# Patient Record
Sex: Female | Born: 1980 | Race: Black or African American | Hispanic: No | Marital: Single | Smoking: Never smoker
Health system: Southern US, Community
[De-identification: ages and names within clinical notes are randomized; demographics above are authoritative.]

## PROBLEM LIST (undated history)

## (undated) DIAGNOSIS — Z22322 Carrier or suspected carrier of Methicillin resistant Staphylococcus aureus: Secondary | ICD-10-CM

## (undated) HISTORY — PX: OTHER SURGICAL HISTORY: SHX169

---

## 2003-03-16 ENCOUNTER — Ambulatory Visit (HOSPITAL_COMMUNITY): Admission: RE | Admit: 2003-03-16 | Discharge: 2003-03-16 | Payer: Self-pay | Admitting: Obstetrics & Gynecology

## 2003-03-16 ENCOUNTER — Encounter: Payer: Self-pay | Admitting: Obstetrics & Gynecology

## 2003-03-23 ENCOUNTER — Ambulatory Visit (HOSPITAL_COMMUNITY): Admission: RE | Admit: 2003-03-23 | Discharge: 2003-03-23 | Payer: Self-pay | Admitting: Obstetrics & Gynecology

## 2003-03-23 ENCOUNTER — Encounter: Payer: Self-pay | Admitting: Obstetrics & Gynecology

## 2003-03-30 ENCOUNTER — Encounter: Payer: Self-pay | Admitting: Obstetrics & Gynecology

## 2003-03-30 ENCOUNTER — Ambulatory Visit (HOSPITAL_COMMUNITY): Admission: RE | Admit: 2003-03-30 | Discharge: 2003-03-30 | Payer: Self-pay | Admitting: Obstetrics & Gynecology

## 2003-06-20 ENCOUNTER — Encounter: Payer: Self-pay | Admitting: Obstetrics & Gynecology

## 2003-06-20 ENCOUNTER — Ambulatory Visit (HOSPITAL_COMMUNITY): Admission: RE | Admit: 2003-06-20 | Discharge: 2003-06-20 | Payer: Self-pay | Admitting: Obstetrics & Gynecology

## 2003-07-21 ENCOUNTER — Inpatient Hospital Stay (HOSPITAL_COMMUNITY): Admission: AD | Admit: 2003-07-21 | Discharge: 2003-07-23 | Payer: Self-pay | Admitting: Obstetrics

## 2004-07-14 ENCOUNTER — Encounter: Admission: RE | Admit: 2004-07-14 | Discharge: 2004-07-14 | Payer: Self-pay | Admitting: Internal Medicine

## 2005-09-28 ENCOUNTER — Emergency Department (HOSPITAL_COMMUNITY): Admission: EM | Admit: 2005-09-28 | Discharge: 2005-09-28 | Payer: Self-pay | Admitting: Emergency Medicine

## 2006-01-25 ENCOUNTER — Emergency Department (HOSPITAL_COMMUNITY): Admission: EM | Admit: 2006-01-25 | Discharge: 2006-01-25 | Payer: Self-pay | Admitting: Emergency Medicine

## 2006-01-28 ENCOUNTER — Emergency Department: Payer: Self-pay | Admitting: Internal Medicine

## 2006-04-28 IMAGING — CT CT ABDOMEN W/ CM
1 of 2 series · 15 of 32 positions shown, 19 images · IV contrast (GASTROGRAFIN & [ID] OMNI 300)
Comparison: none

CLINICAL DATA: Abdominal pain, cramping.
 CT ABDOMEN WITH CONTRAST:
 Multidetector helical scans through the abdomen were performed after oral and IV contrast media were given.  100 cc of Omnipaque 300 were given as the contrast media.
 The lung bases are clear.  The liver enhances normally with no focal abnormality.  No ductal dilatation is seen.  The pancreas is normal in size with normal peripancreatic fat planes.  The adrenal glands are normal, as is the spleen.  The kidneys enhance normally and, on delayed images, the pelvocaliceal systems appear normal.  No adenopathy is seen.  The abdominal aorta is normal in caliber.

[Series 2: a&p w/ · axial · 0.59mm/px · z∈[-304,+26]mm · 15 of 73 slices shown, 19 images]
[im 4/73  soft-tissue]
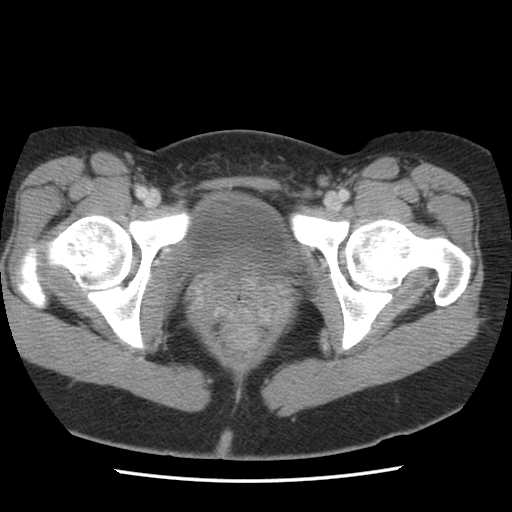
[im 4/73  bone]
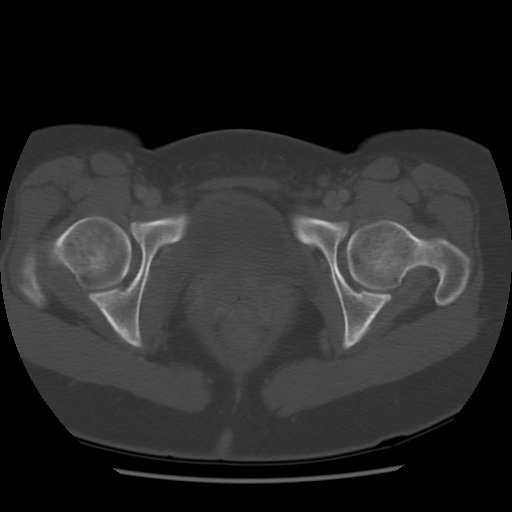
[im 10/73  soft-tissue]
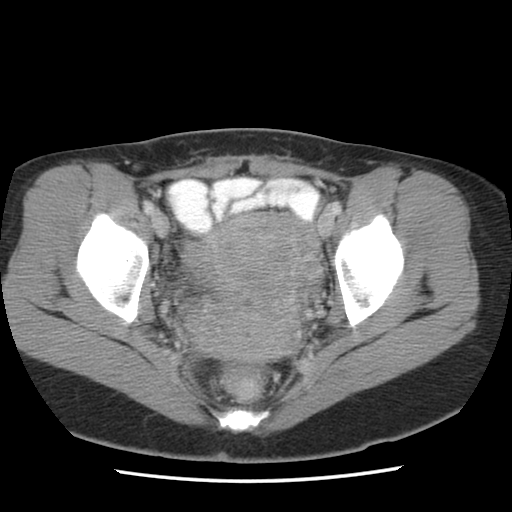
[im 16/73  soft-tissue]
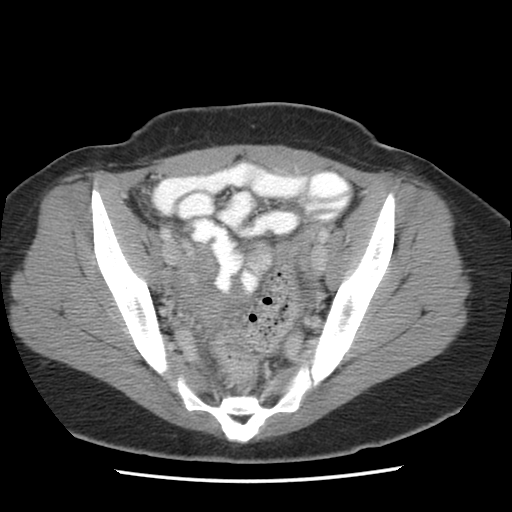
[im 19/73  soft-tissue]
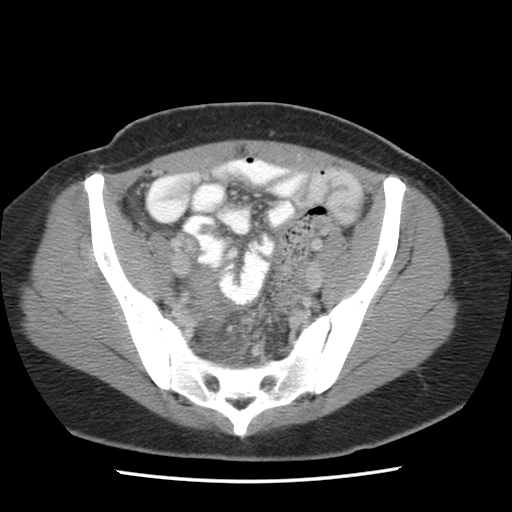
[im 26/73  soft-tissue]
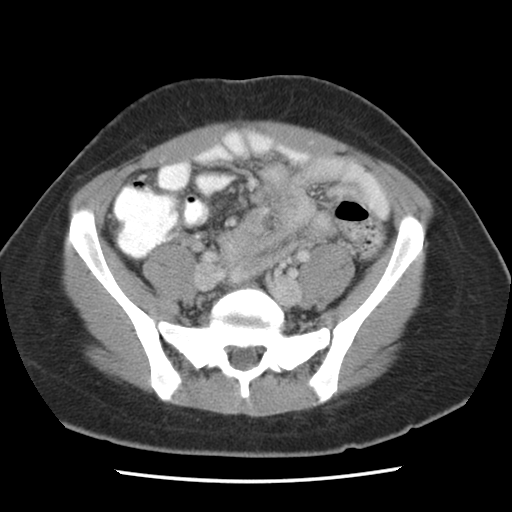
[im 32/73  soft-tissue]
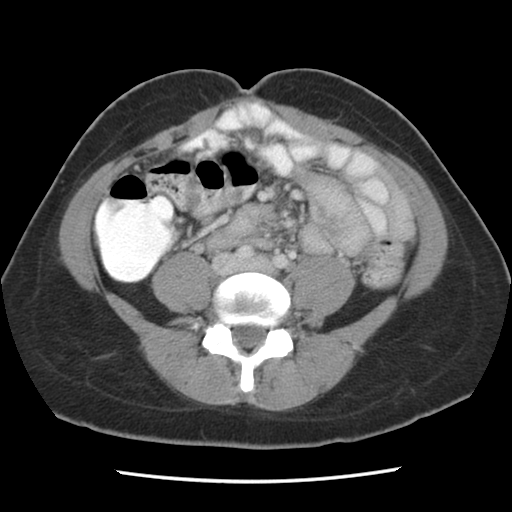
[im 38/73  soft-tissue]
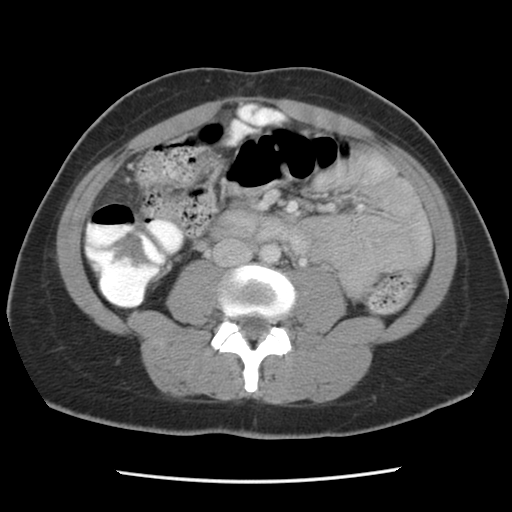
[im 41/73  soft-tissue]
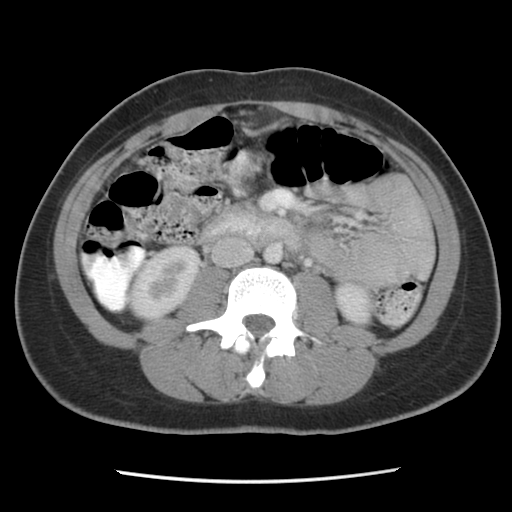
[im 47/73  soft-tissue]
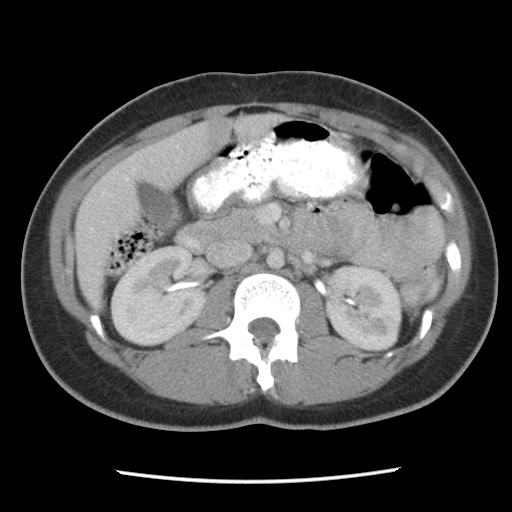
[im 47/73  bone]
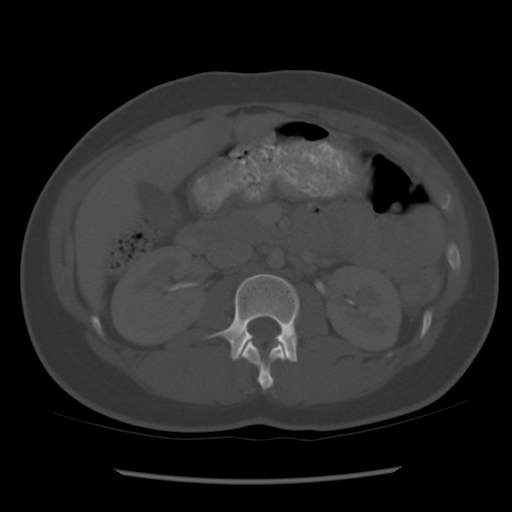
[im 54/73  soft-tissue]
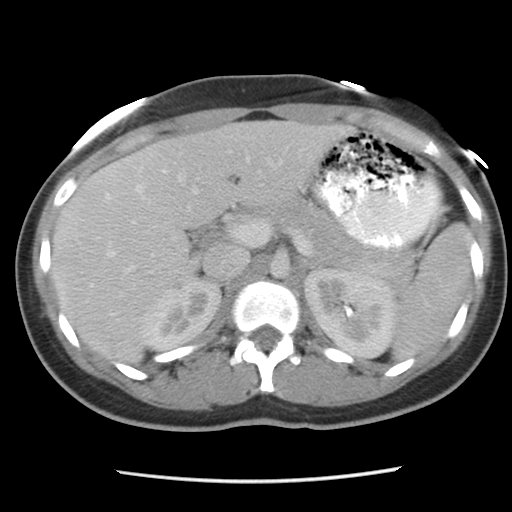
[im 57/73  soft-tissue]
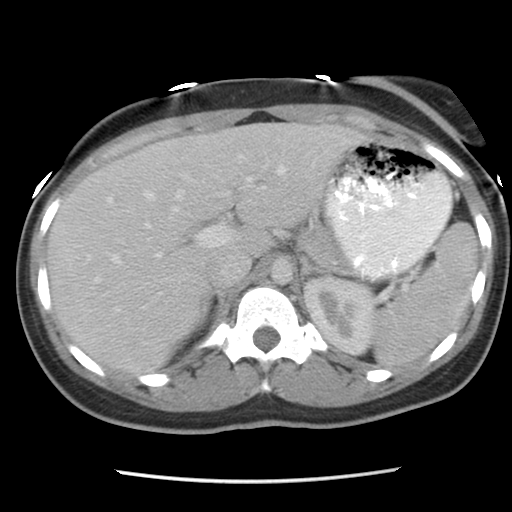
[im 60/73  lung]
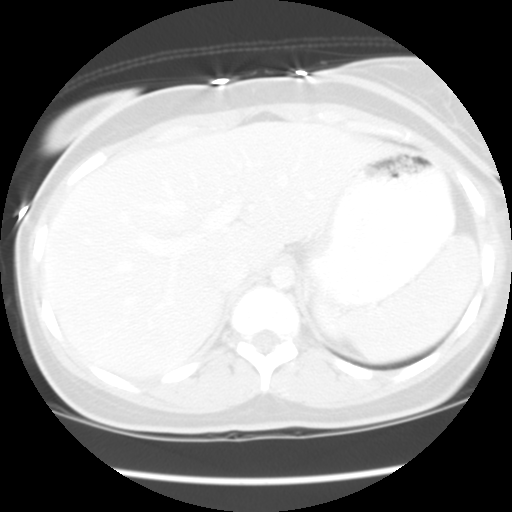
[im 63/73  soft-tissue]
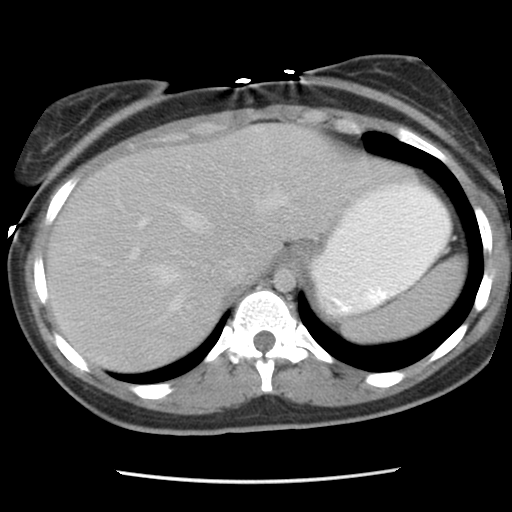
[im 63/73  lung]
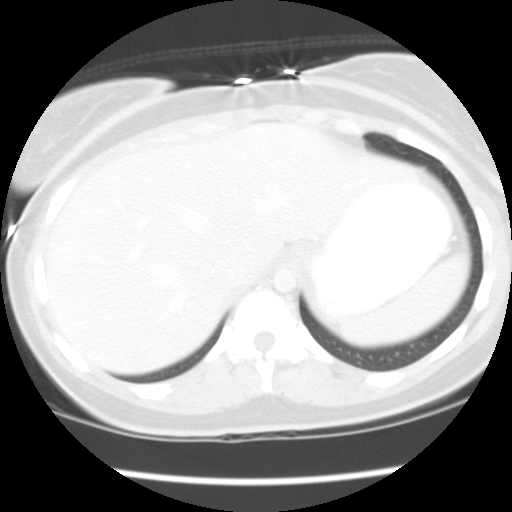
[im 66/73  lung]
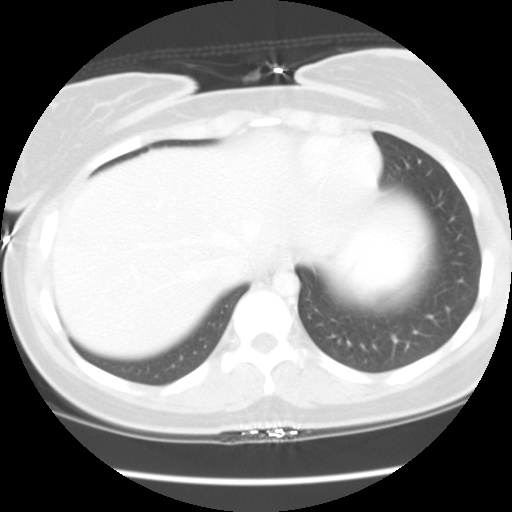
[im 69/73  soft-tissue]
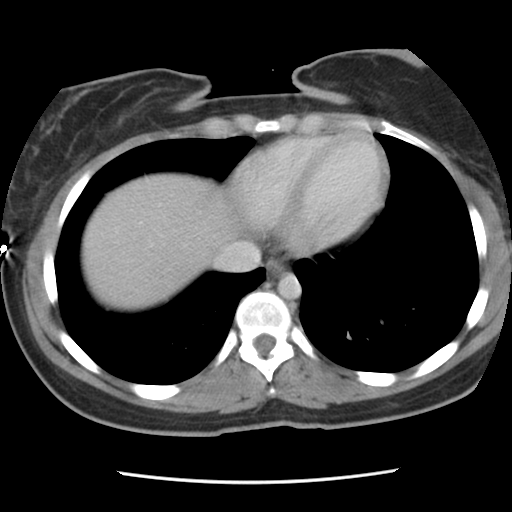
[im 69/73  lung]
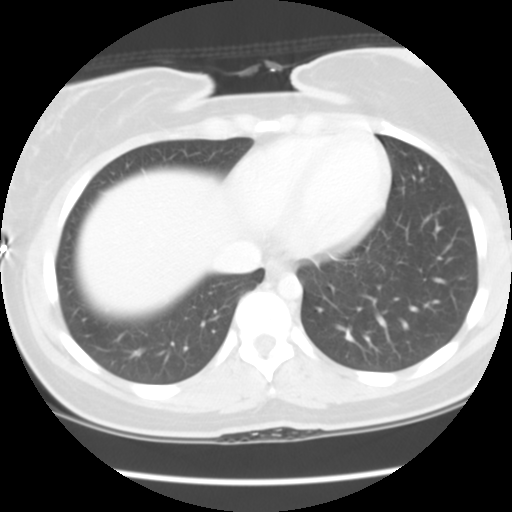

[15 of 32 positions shown; findings below may reference images not displayed]

IMPRESSION: Negative CT of the abdomen.
 CT PELVIS WITH CONTRAST 
 Scans were continued through the pelvis after oral and IV contrast media were given.  The uterus is very inhomogeneous in enhancement pattern and small fibroids cannot be excluded.  There are ovarian follicles present bilaterally.   The urinary bladder is decompressed and unremarkable.  The appendix is normal in size.
IMPRESSION: 1.  Uterus demonstrates somewhat inhomogeneous enhancement pattern of uncertain significance.  Small fibroids cannot be excluded.
 2.  Small ovarian follicles are noted bilaterally. 
 3.  Appendix appears normal.

## 2006-12-20 ENCOUNTER — Emergency Department (HOSPITAL_COMMUNITY): Admission: EM | Admit: 2006-12-20 | Discharge: 2006-12-20 | Payer: Self-pay | Admitting: Family Medicine

## 2007-04-20 ENCOUNTER — Encounter: Admission: RE | Admit: 2007-04-20 | Discharge: 2007-04-20 | Payer: Self-pay | Admitting: Obstetrics

## 2009-06-07 ENCOUNTER — Emergency Department (HOSPITAL_COMMUNITY): Admission: EM | Admit: 2009-06-07 | Discharge: 2009-06-07 | Payer: Self-pay | Admitting: Emergency Medicine

## 2009-07-29 ENCOUNTER — Emergency Department (HOSPITAL_COMMUNITY): Admission: EM | Admit: 2009-07-29 | Discharge: 2009-07-29 | Payer: Self-pay | Admitting: Emergency Medicine

## 2009-12-18 ENCOUNTER — Emergency Department (HOSPITAL_COMMUNITY): Admission: EM | Admit: 2009-12-18 | Discharge: 2009-12-18 | Payer: Self-pay | Admitting: Emergency Medicine

## 2010-05-23 ENCOUNTER — Emergency Department (HOSPITAL_COMMUNITY): Admission: EM | Admit: 2010-05-23 | Discharge: 2010-05-23 | Payer: Self-pay | Admitting: Emergency Medicine

## 2011-01-03 ENCOUNTER — Emergency Department (HOSPITAL_COMMUNITY)
Admission: EM | Admit: 2011-01-03 | Discharge: 2011-01-03 | Disposition: A | Discharge status: Home / Self Care | Payer: Medicaid Other | Attending: Emergency Medicine | Admitting: Emergency Medicine

## 2011-01-03 DIAGNOSIS — Y929 Unspecified place or not applicable: Secondary | ICD-10-CM | POA: Insufficient documentation

## 2011-01-03 DIAGNOSIS — M25519 Pain in unspecified shoulder: Secondary | ICD-10-CM | POA: Insufficient documentation

## 2011-03-25 ENCOUNTER — Emergency Department (HOSPITAL_COMMUNITY)
Admission: EM | Admit: 2011-03-25 | Discharge: 2011-03-25 | Disposition: A | Discharge status: Home / Self Care | Payer: Medicaid Other | Attending: Emergency Medicine | Admitting: Emergency Medicine

## 2011-03-25 ENCOUNTER — Emergency Department (HOSPITAL_COMMUNITY): Payer: Medicaid Other

## 2011-03-25 DIAGNOSIS — R109 Unspecified abdominal pain: Secondary | ICD-10-CM | POA: Insufficient documentation

## 2011-03-25 LAB — WET PREP, GENITAL
Clue Cells Wet Prep HPF POC: NONE SEEN
Yeast Wet Prep HPF POC: NONE SEEN

## 2011-03-25 LAB — URINALYSIS, ROUTINE W REFLEX MICROSCOPIC
Ketones, ur: NEGATIVE mg/dL
Leukocytes, UA: NEGATIVE
Nitrite: NEGATIVE
Protein, ur: NEGATIVE mg/dL
pH: 8 (ref 5.0–8.0)

## 2011-03-25 LAB — POCT PREGNANCY, URINE: Preg Test, Ur: NEGATIVE

## 2011-03-26 LAB — GC/CHLAMYDIA PROBE AMP, GENITAL: Chlamydia, DNA Probe: POSITIVE — AB

## 2013-01-06 IMAGING — CR DG ABDOMEN 2V
2 series · 2 of 2 positions shown · non-contrast
Comparison: Scout image for abdominal CT scan dated 07/14/2004

CLINICAL DATA: Abdominal pain and nausea.

ABDOMEN - 2 VIEW

[w abdomen upright]
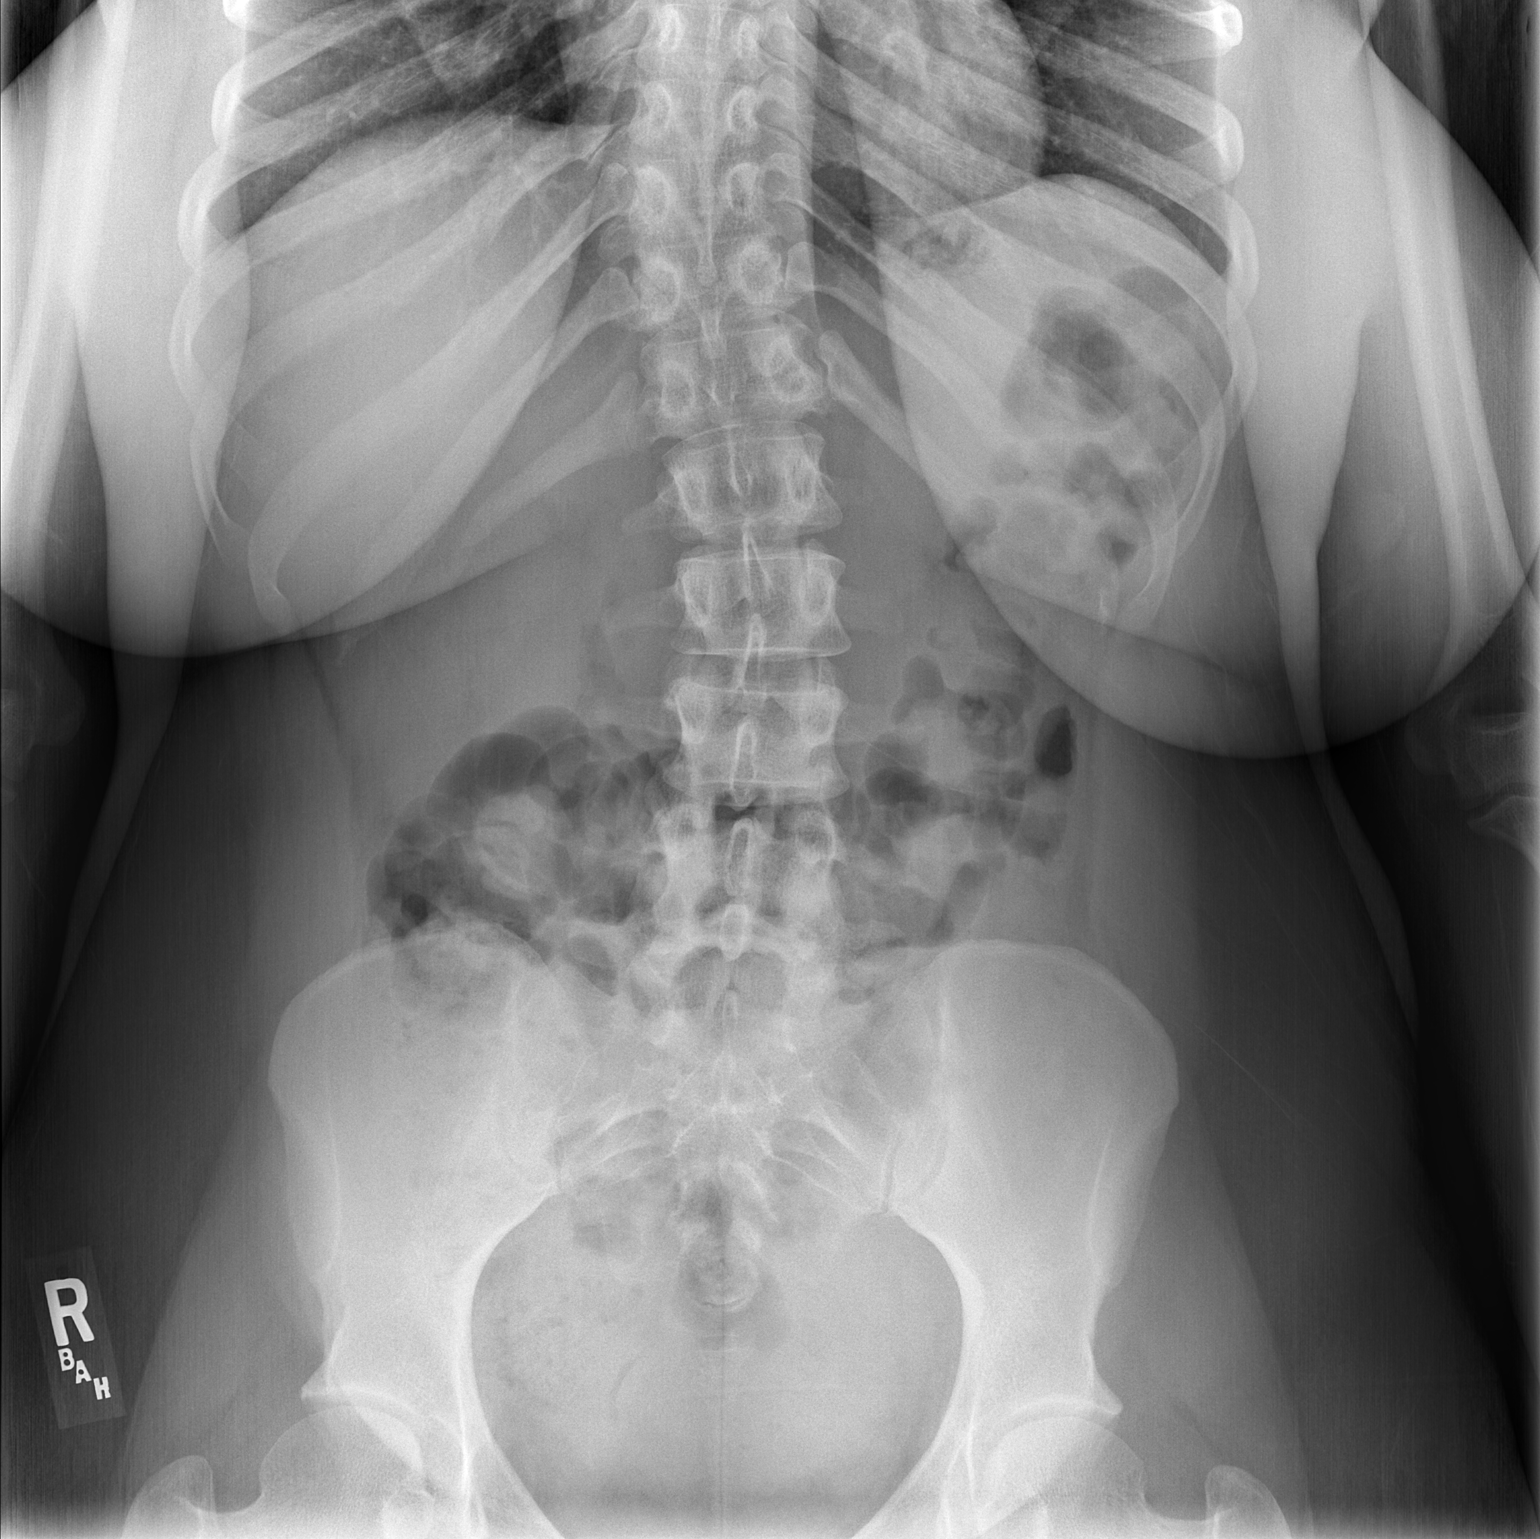

[t abdomen supine]
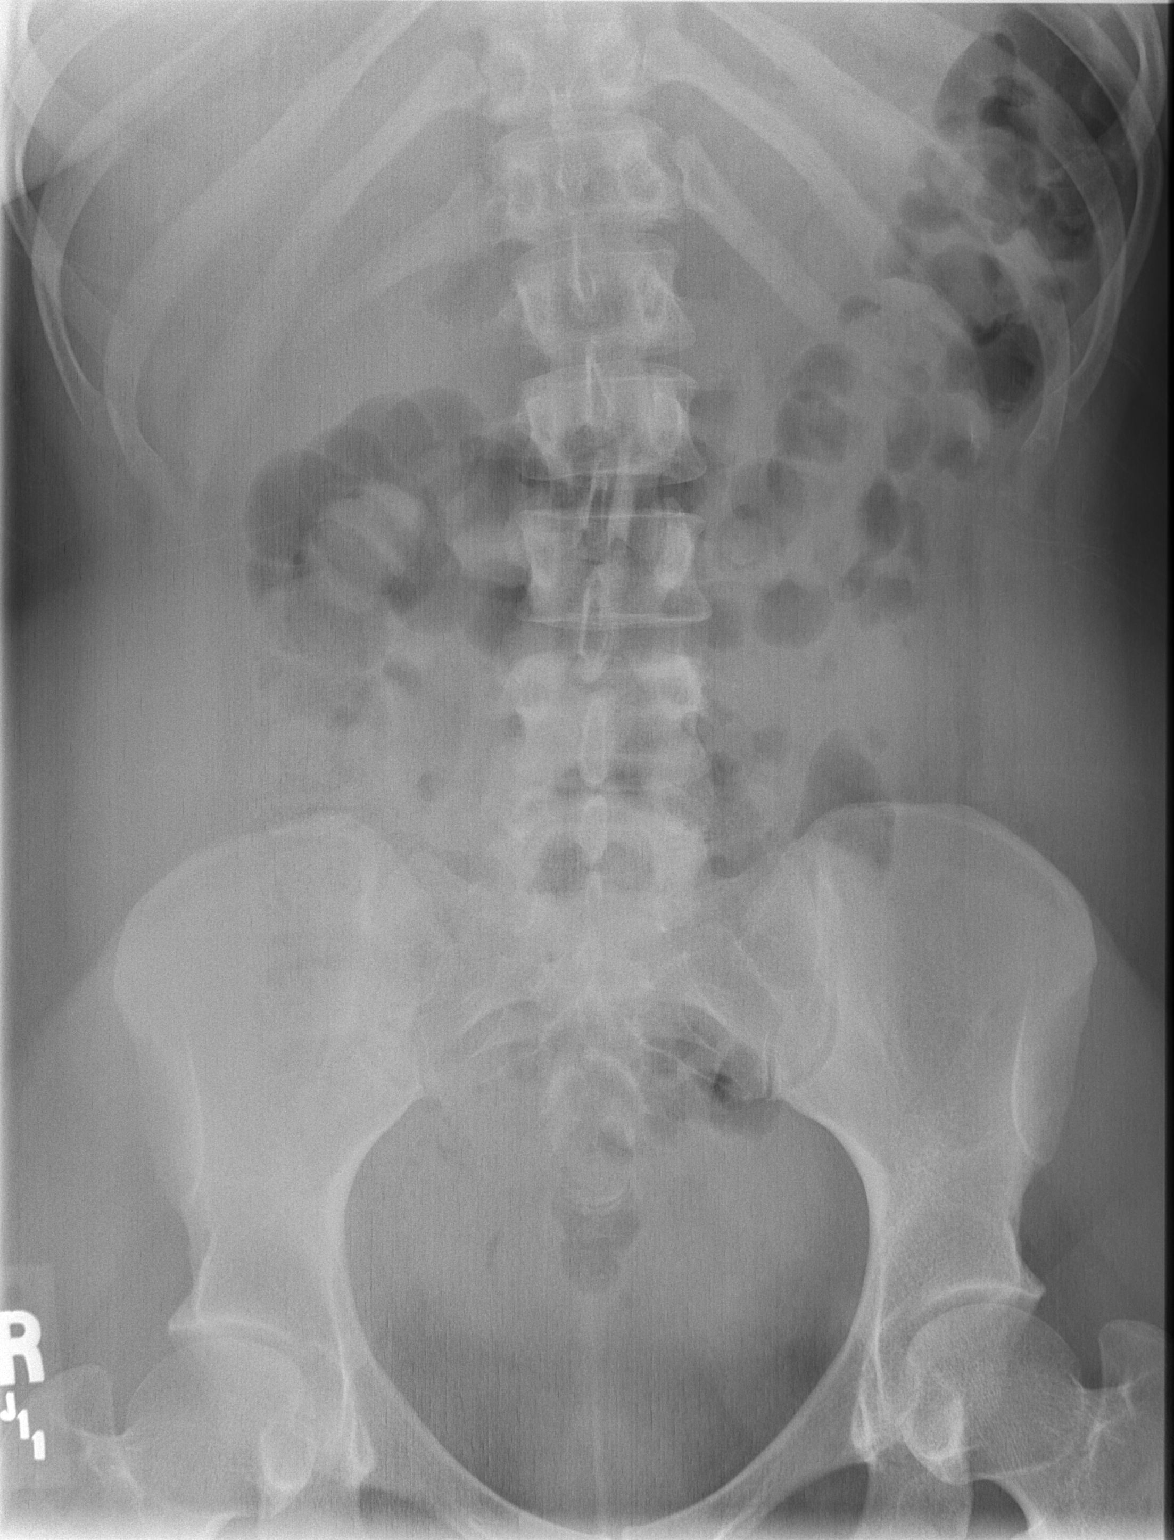

[2 of 2 positions shown; findings below may reference images not displayed]

FINDINGS: There is no free air free fluid in the abdomen.  Bowel
gas pattern is normal.  No worrisome abdominal calcifications.  No
significant osseous abnormality.
IMPRESSION: Benign-appearing abdomen.

## 2016-02-20 ENCOUNTER — Emergency Department (HOSPITAL_COMMUNITY)
Admission: EM | Admit: 2016-02-20 | Discharge: 2016-02-20 | Disposition: A | Discharge status: Home / Self Care | Payer: Medicaid Other | Attending: Emergency Medicine | Admitting: Emergency Medicine

## 2016-02-20 ENCOUNTER — Encounter (HOSPITAL_COMMUNITY): Payer: Self-pay | Admitting: Emergency Medicine

## 2016-02-20 DIAGNOSIS — Y9389 Activity, other specified: Secondary | ICD-10-CM | POA: Diagnosis not present

## 2016-02-20 DIAGNOSIS — S80862A Insect bite (nonvenomous), left lower leg, initial encounter: Secondary | ICD-10-CM | POA: Diagnosis not present

## 2016-02-20 DIAGNOSIS — S40862A Insect bite (nonvenomous) of left upper arm, initial encounter: Secondary | ICD-10-CM | POA: Insufficient documentation

## 2016-02-20 DIAGNOSIS — Y999 Unspecified external cause status: Secondary | ICD-10-CM | POA: Diagnosis not present

## 2016-02-20 DIAGNOSIS — S30861A Insect bite (nonvenomous) of abdominal wall, initial encounter: Secondary | ICD-10-CM | POA: Diagnosis not present

## 2016-02-20 DIAGNOSIS — Y92009 Unspecified place in unspecified non-institutional (private) residence as the place of occurrence of the external cause: Secondary | ICD-10-CM | POA: Insufficient documentation

## 2016-02-20 DIAGNOSIS — S80861A Insect bite (nonvenomous), right lower leg, initial encounter: Secondary | ICD-10-CM | POA: Insufficient documentation

## 2016-02-20 DIAGNOSIS — W57XXXA Bitten or stung by nonvenomous insect and other nonvenomous arthropods, initial encounter: Secondary | ICD-10-CM | POA: Insufficient documentation

## 2016-02-20 DIAGNOSIS — S40861A Insect bite (nonvenomous) of right upper arm, initial encounter: Secondary | ICD-10-CM | POA: Diagnosis not present

## 2016-02-20 DIAGNOSIS — S20369A Insect bite (nonvenomous) of unspecified front wall of thorax, initial encounter: Secondary | ICD-10-CM | POA: Diagnosis not present

## 2016-02-20 MED ORDER — HYDROXYZINE HCL 25 MG PO TABS
25.0000 mg | ORAL_TABLET | Freq: Four times a day (QID) | ORAL | Status: AC
Start: 2016-02-20 — End: ?

## 2016-02-20 MED ORDER — HYDROXYZINE HCL 25 MG PO TABS
25.0000 mg | ORAL_TABLET | Freq: Once | ORAL | Status: AC
  Administered 2016-02-20: 25 mg via ORAL
  Filled 2016-02-20: qty 1

## 2016-02-20 NOTE — ED Provider Notes (Signed)
CSN: [id number]     Arrival date & time 02/20/16  1353 History  By signing my name below, I, Iona Beard, attest that this documentation has been prepared under the direction and in the presence of Arnie Clingenpeel, PA-C.   Electronically Signed: Iona Beard, ED Scribe 02/20/2016 at 3:05 PM.  No chief complaint on file.  The history is provided by the patient. No language interpreter was used.   HPI Comments: Catherine Mccann is a 35 y.o. female who presents to the Emergency Department complaining of multiple, pruritic bug bites, onset yesterday while cleaning her house. Pt reports that she has "bugs all over her". She notes bug bites to all four extremities and on her trunk. Pt states that she knows that she has fleas in her house from her cat and two dogs. They attempted a "flea bomb" yesterday before the bites. No other associated symptoms. No worsening or alleviating factors noted. She has not tried any OTC medications. Pt denies fever, chills, or any other pertinent symptoms.   History reviewed. No pertinent past medical history. Past Surgical History  Procedure Laterality Date  . C secton     No family history on file. Social History  Substance Use Topics  . Smoking status: Never Smoker   . Smokeless tobacco: None  . Alcohol Use: Yes   OB History    No data available     Review of Systems  Constitutional: Negative for chills.  Skin:       Multiple bug bites on all four extremities and trunk.  All other systems reviewed and are negative.    Allergies  Review of patient's allergies indicates no known allergies.  Home Medications   Prior to Admission medications   Medication Sig Start Date End Date Taking? Authorizing Provider  hydrOXYzine (ATARAX/VISTARIL) 25 MG tablet Take 1 tablet (25 mg total) by mouth every 6 (six) hours. 02/20/16   Catherine Gronau, PA-C   BP 142/90 mmHg  Pulse 109  Temp(Src) 98.1 F (36.7 C) (Oral)  Resp 20  SpO2 100% Physical Exam   Constitutional: She appears well-developed and well-nourished. No distress.  Nontoxic appearing. Pt is anxious and scratching at her skin  HENT:  Head: Normocephalic and atraumatic.  Right Ear: External ear normal.  Left Ear: External ear normal.  Eyes: Conjunctivae are normal. Right eye exhibits no discharge. Left eye exhibits no discharge. No scleral icterus.  Neck: Normal range of motion.  Cardiovascular: Normal rate.   Pulmonary/Chest: Effort normal.  Musculoskeletal: Normal range of motion.  Moves all extremities spontaneously  Neurological: She is alert. Coordination normal.  Skin: Skin is warm and dry. Rash noted.  Multiple flesh colored papules noted to torso and extremities. Multiple overlying excoriations and pt actively scratching at her skin. No active bleeding. No erythema, induration, fluctuance, tenderness, targetoid lesions or desquamation.   Psychiatric: Her behavior is normal. Her mood appears anxious.  Nursing note and vitals reviewed.   ED Course  Procedures (including critical care time) DIAGNOSTIC STUDIES: Oxygen Saturation is 100% on RA, normal by my interpretation.    COORDINATION OF CARE: 2:54 PM Discussed treatment plan with pt at bedside and pt agreed to plan.  Labs Review Labs Reviewed - No data to display  Imaging Review No results found.   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 Afebrile. Pt  is anxiously scratching at her skin. Multiple flesh colored papules with overlying excoriations from pt scratching. No signs of superficial skin infection. Given atarax in ED which pt reports improved her symptoms. Will discharge with rx. Instructed pt call exterminator, keep animals out of the house until treated for fleas and given information from [facility]. Return precautions given in discharge paperwork and discussed with pt at  bedside. Pt stable for discharge   I personally performed the services described in this documentation, which was scribed in my presence. The recorded information has been reviewed and is accurate.    Catherine GalaStevi Vere Diantonio, PA-C 02/20/16 1505  Donnetta HutchingBrian Cook, MD 02/21/16 1310

## 2016-02-20 NOTE — ED Notes (Signed)
Pt extremely anxious c/o "bugs all over her".

## 2016-02-20 NOTE — Discharge Instructions (Signed)
Call exterminator. Avoid scratching. Insect Bite Mosquitoes, flies, fleas, bedbugs, and many other insects can bite. Insect bites are different from insect stings. A sting is when poison (venom) is injected into the skin. Insect bites can cause pain or itching for a few days, but they are usually not serious. Some insects can spread diseases to people through a bite. SYMPTOMS  Symptoms of an insect bite include:  Itching or pain in the bite area.  Redness and swelling in the bite area.  An open wound (skin ulcer). In many cases, symptoms last for 2-4 days.  DIAGNOSIS  This condition is usually diagnosed based on symptoms and a physical exam. TREATMENT  Treatment is usually not needed for an insect bite. Symptoms often go away on their own. Your health care provider may recommend creams or lotions to help reduce itching. Antibiotic medicines may be prescribed if the bite becomes infected. A tetanus shot may be given in some cases. If you develop an allergic reaction to an insect bite, your health care provider will prescribe medicines to treat the reaction (antihistamines). This is rare. HOME CARE INSTRUCTIONS  Do not scratch the bite area.  Keep the bite area clean and dry. Wash the bite area daily with soap and water as told by your health care provider.  If directed, applyice to the bite area.  Put ice in a plastic bag.  Place a towel between your skin and the bag.  Leave the ice on for 20 minutes, 2-3 times per day.  To help reduce itching and swelling, try applying a baking soda paste, cortisone cream, or calamine lotion to the bite area as told by your health care provider.  Apply or take over-the-counter and prescription medicines only as told by your health care provider.  If you were prescribed an antibiotic medicine, use it as told by your health care provider. Do not stop using the antibiotic even if your condition improves.  Keep all follow-up visits as told by your  health care provider. This is important. PREVENTION   Use insect repellent. The best insect repellents contain:  DEET, picaridin, oil of lemon eucalyptus (OLE), or IR3535.  Higher amounts of an active ingredient.  When you are outdoors, wear clothing that covers your arms and legs.  Avoid opening windows that do not have window screens. SEEK MEDICAL CARE IF:  You have increased redness, swelling, or pain in the bite area.  You have a fever. SEEK IMMEDIATE MEDICAL CARE IF:   You have joint pain.   You have fluid, blood, or pus coming from the bite area.  You have a headache or neck pain.  You have unusual weakness.  You have a rash.  You have chest pain or shortness of breath.  You have abdominal pain, nausea, or vomiting.  You feel unusually tired or sleepy.   This information is not intended to replace advice given to you by your health care provider. Make sure you discuss any questions you have with your health care provider.   Document Released: 10/08/2004 Document Revised: 05/22/2015 Document Reviewed: 01/16/2015 Elsevier Interactive Patient Education [employer]2016 Elsevier Inc.

## 2016-02-22 ENCOUNTER — Encounter (HOSPITAL_COMMUNITY): Payer: Self-pay | Admitting: *Deleted

## 2016-02-22 ENCOUNTER — Emergency Department (HOSPITAL_COMMUNITY)
Admission: EM | Admit: 2016-02-22 | Discharge: 2016-02-22 | Disposition: A | Discharge status: Home / Self Care | Payer: Medicaid Other | Attending: Emergency Medicine | Admitting: Emergency Medicine

## 2016-02-22 DIAGNOSIS — L299 Pruritus, unspecified: Secondary | ICD-10-CM

## 2016-02-22 DIAGNOSIS — F419 Anxiety disorder, unspecified: Secondary | ICD-10-CM | POA: Diagnosis not present

## 2016-02-22 DIAGNOSIS — Z79899 Other long term (current) drug therapy: Secondary | ICD-10-CM | POA: Insufficient documentation

## 2016-02-22 MED ORDER — HYDROXYZINE HCL 10 MG PO TABS
10.0000 mg | ORAL_TABLET | Freq: Once | ORAL | Status: AC
  Administered 2016-02-22: 10 mg via ORAL
  Filled 2016-02-22: qty 1

## 2016-02-22 MED ORDER — HYDROCORTISONE 1 % EX CREA: TOPICAL_CREAM | CUTANEOUS | Status: AC

## 2016-02-22 MED ORDER — IBUPROFEN 400 MG PO TABS: 600.0000 mg | ORAL_TABLET | Freq: Three times a day (TID) | ORAL | Status: DC | PRN

## 2016-02-22 MED ORDER — LORAZEPAM 0.5 MG PO TABS
1.0000 mg | ORAL_TABLET | Freq: Once | ORAL | Status: AC
  Administered 2016-02-22: 1 mg via ORAL
  Filled 2016-02-22: qty 2

## 2016-02-22 NOTE — ED Notes (Signed)
Declined W/C at D/C and was escorted to lobby by RN. 

## 2016-02-22 NOTE — ED Notes (Signed)
Pt observed picking at skin on breast . Pt stated there are human fleas in my skin.

## 2016-02-22 NOTE — ED Provider Notes (Signed)
CSN: [id number]     Arrival date & time 02/22/16  1259 History  By signing my name below, I, Rosario AdieWilliam Andrew Hiatt, attest that this documentation has been prepared under the direction and in the presence of Samantha Dowless, PA-C.   Electronically Signed: Rosario AdieWilliam Andrew Hiatt, ED Scribe. 02/22/2016. 1:58 PM.   No chief complaint on file.  The history is provided by the patient. No language interpreter was used.   HPI Comments: Catherine Mccann is a 35 y.o. female who presents to the Emergency Department complaining of multiple, pruritic insect bites that began 2 months ago, but worsened 3 days ago PTA. Pt has multiple bug bites on her bilateral extremities, torso, inside of her mouth, and states that she "has fleas in her skin". Pt states these flease are different from animal fleas and they are embedded in her pores. Pt was seen in the ED on 02/20/16 with similar symptoms. Pt reports that she had multiple flea-infested animals in her home which she removed two days ago PTA. She states that she began cleaning her home after she removed the animals before she was seen in the ED on 02/20/16, which worsened her symptoms. She reports that she took a bath with vinegar and bleach which she states resolved her symptoms. Pt has not had an exterminator come to her home to remove the fleas.  No past medical history on file. Past Surgical History  Procedure Laterality Date  . C secton     No family history on file. Social History  Substance Use Topics  . Smoking status: Never Smoker   . Smokeless tobacco: Not on file  . Alcohol Use: Yes   OB History    No data available     Review of Systems A complete 10 system review of systems was obtained and all systems are negative except as noted in the HPI and PMH.   Allergies  Review of patient's allergies indicates no known allergies.  Home Medications   Prior to Admission medications   Medication Sig Start Date End Date Taking? Authorizing  Provider  hydrOXYzine (ATARAX/VISTARIL) 25 MG tablet Take 1 tablet (25 mg total) by mouth every 6 (six) hours. 02/20/16   Stevi Barrett, PA-C   BP 147/95 mmHg  Pulse 119  Temp(Src) 98.5 F (36.9 C) (Oral)  Resp 18  Ht 5\' 3"  (1.6 m)  Wt 153 lb (69.4 kg)  BMI 27.11 kg/m2  SpO2 98%   Physical Exam  Constitutional: She is oriented to person, place, and time. She appears well-developed and well-nourished. No distress.  HENT:  Head: Normocephalic and atraumatic.  Eyes: Conjunctivae are normal. Right eye exhibits no discharge. Left eye exhibits no discharge. No scleral icterus.  Cardiovascular: Normal rate.   Pulmonary/Chest: Effort normal.  Neurological: She is alert and oriented to person, place, and time. Coordination normal.  Skin: Skin is warm and dry. She is not diaphoretic. No pallor.  Multiple flesh colored papules to bilateral breasts, torso, and extremities with overlaying excoriations. No surrounding erythema, drainage, fluctuance, or induration. No vesicles, no active bleeding.  Psychiatric: Her mood appears anxious. Her speech is rapid and/or pressured. Thought content is paranoid.  Nursing note and vitals reviewed.  Chaperone present throughout entire exam.   ED Course  Procedures (including critical care time)  DIAGNOSTIC STUDIES: Oxygen Saturation is 98% on RA, normal by my interpretation.   COORDINATION OF CARE: 1:58 PM-Discussed next steps with pt. Pt verbalized understanding and is agreeable with the plan.  MDM   Final diagnoses:  Pruritus  Anxiety   Pt is extremely anxious, stating that there are fleas living in her skin. I do not visualize any bugs on patient. Pt has multiple excoriation across torso and extremities. Pt states that she was here 2 days ago for same symptoms which have not improved and she is requesting that a sample to be taken to be evaluated under a microscope. Pt has provided a sample in a urine specimen cup. I spoke with lab and they will  send sample to [facility] to be evaluated. Again, I have a low suspicion that this is actually an insect. It is possible that pt has fleas in her home given that she has animals. Recommend that pt call an exterminator. Pt given Hydroxyzine and Atarax for pruritus and anxiety. Will also given Hydrocortisone cream rx. F/u with PCP indicated. Resources for local psychiatric providers given per request of pts sister who is at bedside.  I personally performed the services described in this documentation, which was scribed in my presence. The recorded information has been reviewed and is accurate.     Lester Kinsman Makanda, PA-C 02/23/16 0915  Raeford Razor, MD 03/10/16 1100

## 2016-02-22 NOTE — ED Notes (Signed)
Pt returns today for insect bites . Pt last seen on 02-20-16 for same. Pt observed to have multiple bites to arms ,breast . Pt reports bites to inside of her mouth because she has human fleas and they can bite the inside of you mouth. Pt anxious  And tearful.

## 2016-02-22 NOTE — Discharge Instructions (Signed)
Pruritus Pruritus is an itching feeling. There are many different conditions and factors that can make your skin itchy. Dry skin is one of the most common causes of itching. Most cases of itching do not require medical attention. Itchy skin can turn into a rash.  HOME CARE INSTRUCTIONS  Watch your pruritus for any changes. Take these steps to help with your condition:  Skin Care  Moisturize your skin as needed. A moisturizer that contains petroleum jelly is best for keeping moisture in your skin.  Take or apply medicines only as directed by your health care provider. This may include:  Corticosteroid cream.  Anti-itch lotions.  Oral anti-histamines.  Apply cool compresses to the affected areas.  Try taking a bath with:  Epsom salts. Follow the instructions on the packaging. You can get these at your local pharmacy or grocery store.  Baking soda. Pour a small amount into the bath as directed by your health care provider.  Colloidal oatmeal. Follow the instructions on the packaging. You can get this at your local pharmacy or grocery store.  Try applying baking soda paste to your skin. Stir water into baking soda until it reaches a paste-like consistency.   Do not scratch your skin.  Avoid hot showers or baths, which can make itching worse. A cold shower may help with itching as long as you use a moisturizer after.  Avoid scented soaps, detergents, and perfumes. Use gentle soaps, detergents, perfumes, and other cosmetic products. General Instructions  Avoid wearing tight clothes.  Keep a journal to help track what causes your itch. Write down:  What you eat.  What cosmetic products you use.  What you drink.  What you wear. This includes jewelry.  Use a humidifier. This keeps the air moist, which helps to prevent dry skin. SEEK MEDICAL CARE IF:  The itching does not go away after several days.  You sweat at night.  You have weight loss.  You are unusually  thirsty.  You urinate more than normal.  You are more tired than normal.  You have abdominal pain.  Your skin tingles.  You feel weak.  Your skin or the whites of your eyes look yellow (jaundice).  Your skin feels numb.   This information is not intended to replace advice given to you by your health care provider. Make sure you discuss any questions you have with your health care provider.   Document Released: 05/13/2011 Document Revised: 01/15/2015 Document Reviewed: 08/27/2014 Elsevier Interactive Patient Education 2016 Elsevier Inc.  Generalized Anxiety Disorder Generalized anxiety disorder (GAD) is a mental disorder. It interferes with life functions, including relationships, work, and school. GAD is different from normal anxiety, which everyone experiences at some point in their lives in response to specific life events and activities. Normal anxiety actually helps [place] prepare for and get through these life events and activities. Normal anxiety goes away after the event or activity is over.  GAD causes anxiety that is not necessarily related to specific events or activities. It also causes excess anxiety in proportion to specific events or activities. The anxiety associated with GAD is also difficult to control. GAD can vary from mild to severe. People with severe GAD can have intense waves of anxiety with physical symptoms (panic attacks).  SYMPTOMS The anxiety and worry associated with GAD are difficult to control. This anxiety and worry are related to many life events and activities and also occur more days than not for 6 months or longer. People with GAD  also have three or more of the following symptoms (one or more in children):  Restlessness.   Fatigue.  Difficulty concentrating.   Irritability.  Muscle tension.  Difficulty sleeping or unsatisfying sleep. DIAGNOSIS GAD is diagnosed through an assessment by your health care provider. Your health care provider will  ask you questions aboutyour mood,physical symptoms, and events in your life. Your health care provider may ask you about your medical history and use of alcohol or drugs, including prescription medicines. Your health care provider may also do a physical exam and blood tests. Certain medical conditions and the use of certain substances can cause symptoms similar to those associated with GAD. Your health care provider may refer you to a mental health specialist for further evaluation. TREATMENT The following therapies are usually used to treat GAD:   Medication. Antidepressant medication usually is prescribed for long-term daily control. Antianxiety medicines may be added in severe cases, especially when panic attacks occur.   Talk therapy (psychotherapy). Certain types of talk therapy can be helpful in treating GAD by providing support, education, and guidance. A form of talk therapy called cognitive behavioral therapy can teach you healthy ways to think about and react to daily life events and activities.  Stress managementtechniques. These include yoga, meditation, and exercise and can be very helpful when they are practiced regularly. A mental health specialist can help determine which treatment is best for you. Some people see improvement with one therapy. However, other people require a combination of therapies.   This information is not intended to replace advice given to you by your health care provider. Make sure you discuss any questions you have with your health care provider.   Document Released: 12/26/2012 Document Revised: 09/21/2014 Document Reviewed: 12/26/2012 Elsevier Interactive Patient Education [employer].  Your sample was sent to the pathology lab today so that it can be evaluated under a microscope. This may take a few days to result. If you do not hear from us in 3-4 days, please call and ask for results. Continue using home Atarax for itch and for anxiety. Apply  hydrocortisone cream to skin daily. You'll need to have an exterminator come to the home to eradicate possible flea infestation. Recommend follow-up with [facility] if your anxiety does not improve. Return to the emergency department a few experience fevers, chills, chest pain, shortness of breath.

## 2016-02-23 ENCOUNTER — Encounter (HOSPITAL_COMMUNITY): Payer: Self-pay | Admitting: Emergency Medicine

## 2016-02-23 ENCOUNTER — Emergency Department (HOSPITAL_COMMUNITY)
Admission: EM | Admit: 2016-02-23 | Discharge: 2016-02-23 | Disposition: A | Discharge status: Home / Self Care | Payer: Medicaid Other | Attending: Emergency Medicine | Admitting: Emergency Medicine

## 2016-02-23 DIAGNOSIS — Z79899 Other long term (current) drug therapy: Secondary | ICD-10-CM | POA: Diagnosis not present

## 2016-02-23 DIAGNOSIS — R202 Paresthesia of skin: Secondary | ICD-10-CM | POA: Diagnosis not present

## 2016-02-23 DIAGNOSIS — R21 Rash and other nonspecific skin eruption: Secondary | ICD-10-CM | POA: Diagnosis present

## 2016-02-23 MED ORDER — PERMETHRIN 5 % EX CREA: TOPICAL_CREAM | CUTANEOUS | Status: AC

## 2016-02-23 NOTE — ED Notes (Signed)
Patient not in room. Appears to have left ED prior to giving discharge signature. Patient had received discharge teaching, papers, and prescription.

## 2016-02-23 NOTE — ED Notes (Addendum)
Pt reports flea and biting across entire body, states they are burrowing under her skin. States she managed to remove one flea a couple days ago. Patient seen by multiple providers who tell her the condition is psychological. Pt denies itch, states only pain. Patient reports digging a few fleas from under her skin. Pt points to fleas she can see, fleas patient points to appear to be sweat glands to this RN. Pt states they are burrowing fleas, not scabies.

## 2016-02-23 NOTE — Discharge Instructions (Signed)
Do not hesitate to return to the emergency room for any new, worsening or concerning symptoms.  Please obtain primary care using resource guide below. Let them know that you were seen in the emergency room and that they will need to obtain records for further outpatient management.   Community Resource Guide Outpatient Counseling/Substance Abuse Adult The [employer] 211 is a great source of information about community services available.  Access by dialing 2-1-1 from anywhere in [place], or by website -  CustodianSupply.fi.   Other Local Resources (Updated 09/2015)  [employer]  Crisis Hotline, available 24 hours a day, 7 days a week: [place], [place]   [facility]  Crisis Hotline, available 24 hours a day, 7 days a week: [place], [place]  [facility]  Suicide Prevention Hotline, available 24 hours a day, 7 days a week: [place], [place], available 24 hours a day, 7 days a week: [place], [place] Access to [employer], available 24 hours a day, 7 days a week: 2402330447 All   [facility]  Crisis Hotline, available 24 hours a day, 7 days a week: [phone] All   Other Local Resources (Updated 09/2015)  Outpatient Counseling/ Substance Abuse Programs  Services     Address and Phone Number  ADS (Alcohol and Drug Services)   Options include Individual counseling, group counseling, intensive outpatient program (several hours a day, several days a week)  Offers depression assessments  Provides methadone maintenance program [phone] [address], [place], [place] partial hospitalization/day treatment and DUI/DWI programs  [employer], private insurance [phone] [address], [phone] [place], [place] [address]  [place] include intensive outpatient program (several hours a day, several days a week), outpatient treatment, DUI/DWI services, family education  Also has some services specifically for [employer] transitional housing  [phone] [address] [place], [place] [address]     [place] Medicare, private pay, and private insurance [phone] [address] [place], [place] [address]  [facility]  Services include individual counseling, substance abuse intensive outpatient program (several hours a day, several days a week), day treatment  Blinda Leatherwood, Medicaid, private insurance [phone] [address], [place], [place] [address]  [facility]   Offers substance abuse intensive outpatient program (several hours a day, several days a week), partial hospitalization program [phone] [address] [place], [place] [address]  [phone] [address] [place], [place] [address]  [phone] [place], [place] [address]  [phone], [place], [place] [address]  [facility]  Individual counseling only  Accepts private insurance only [phone] [address] [place], [place] [address]  [facility]: [facility]adone Clinic  Methadone maintenance program [phone] [place], [place] [address]  [facility] providing substance abuse and mental health counseling  Accepts Medicaid, Medicare, private insurance  Offers sliding scale for uninsured [phone] [place], [place] in [place] individual counseling, and intensive in-home services [phone] [address], [place], [place] [address]  [facility]  individual counseling, family counseling, group therapy, domestic violence counseling, consumer credit counseling  Accepts Medicare, Medicaid, private insurance  Offers sliding scale for uninsured [phone] [address] [place], [place] [address]  [phone] [facility], [address] [place], [place]  [facility]  Offers individual, family and group counseling  3 locations - [place], [place], and [place]  [address] [place], [place] [address]  [address] [place], [place] [address]  [place], [place] [address]  [facility]    Offers psychiatric assessment, 8-week Intensive Outpatient Program (several hours a day, several times a week, daytime or evenings), early recovery group, family Program, medication management  Private pay or private insurance only [phone], or  [phone] [address] [place], [place] [address]  [facility]  Offers individual, couples and family counseling  Accepts Medicaid, private insurance, and sliding scale for uninsured [phone] [address] [place], [place] [address]  Launa Flight, MD  Individual counseling  Private insurance [phone] [place], [place] [address]  [facility]   Offers assessment, substance abuse treatment, and behavioral health treatment [phone] [address] [place], [place] [address]  [place]  Individual counseling  Accepts private insurance [phone] [place], [place] [address]  [facility]  Individual counseling  Blinda Leatherwood, private insurance [phone] [place], [place] [address]  [place]    Offers intensive outpatient program (several hours a day, several times a week)  Private pay, private insurance [phone] [place], [place]  Individual counseling  Medicare, private insurance [phone] [address], [place], [place] [address]  [place]    Offers intensive outpatient program (several hours a day, several times a week) and partial hospitalization program [phone] [place], [place] [address]  Letta Moynahan, MD  Individual counseling [phone] [address], [place], [place] [address]  [place] counseling to individuals, couples, and families  Accepts Medicare and private insurance; offers sliding scale for uninsured [phone] [place], [place] [address]  [facility]  Christian counseling [phone] [address] [place], [place] [address]  [facility] [employer] crisis counseling, individual counseling, group therapy, in-home therapy, domestic violence services, day treatment, DWI services, Conservation officer, nature (CST), Assertive Community Treatment Team (ACTT), substance abuse Intensive Outpatient Program (several hours a day, several times a week)  2 locations - [place] and [place], [place] [address]  [phone] [address] [place], [place] [address]  [facility] counseling and group therapy  [place] insurance, [place], [place] [phone] [address] [place], [place]  [facility]  Offers individual and family counseling  Offers LGBTQ services  Accepts private insurance and private pay [phone] [place], [place] [address]  [facility]    Offers individual counseling, group therapy, and outpatient detox  Accepts private insurance [phone] [place], [employer] Medicare, private insurance [phone] [address] [place], [place] [address]  [employer]  Individual counseling  Accepts Medicare, private insurance [phone] [address] [place], [place] [address]  [facility]   Offers substance abuse Intensive Outpatient Program (  several hours a day, several times a week) [phone], or [phone] [facility], [place]    Paresthesia Paresthesia is an abnormal burning or prickling sensation. This sensation is generally felt in the hands, arms, legs, or feet. However, it may occur in any part of the body. Usually, it is not painful. The feeling may be described as:  Tingling or numbness.  Pins and needles.  Skin crawling.  Buzzing.  Limbs falling asleep.  Itching. Most people experience temporary (transient) paresthesia at some time in their lives. Paresthesia may occur when you breathe too quickly (hyperventilation). It can also occur without any apparent cause. Commonly, paresthesia occurs when pressure is placed on a nerve. The sensation quickly goes away after the pressure is removed. For some people, however, paresthesia is a long-lasting (chronic) condition that is caused by an underlying disorder. If you continue to have paresthesia, you may need further medical evaluation. HOME CARE INSTRUCTIONS Watch your condition for any changes. Taking the following actions may help to lessen any discomfort that you are feeling:  Avoid drinking alcohol.  Try acupuncture or massage to help relieve your symptoms.  Keep all follow-up visits as directed by your health care provider. This is important. SEEK MEDICAL CARE IF:  You continue to have episodes of paresthesia.  Your burning or prickling feeling gets worse when you walk.  You have pain, cramps, or dizziness.  You develop a rash. SEEK IMMEDIATE MEDICAL CARE IF:  You feel weak.  You have trouble walking or  moving.  You have problems with speech, understanding, or vision.  You feel confused.  You cannot control your bladder or bowel movements.  You have numbness after an injury.  You faint.   This information is not intended to replace advice given to you by your health care provider. Make sure you discuss any questions you have with your health care provider.   Document Released: 08/21/2002 Document Revised: 01/15/2015 Document Reviewed: 08/27/2014 Elsevier Interactive Patient Education [employer]2016 Elsevier Inc.

## 2016-02-23 NOTE — ED Provider Notes (Signed)
CSN: [id number]     Arrival date & time 02/23/16  1607 History   First MD Initiated Contact with Patient 02/23/16 1620    CC: Bug Bits  (Consider location/radiation/quality/duration/timing/severity/associated sxs/prior Treatment) HPI  Blood pressure 116/96, pulse 104, temperature 99.1 F (37.3 C), temperature source Oral, resp. rate 16, SpO2 100 %.  Catherine Mccann is a 35 y.o. female who is otherwise healthy complaining of flea bites across entire biting, states they are burrowing into her skin and she is not been helped by the medications that she is received from the ED yesterday, she was written a prescription for Atarax and hydrocortisone ointment. Patient denies fever, chills, nausea, vomiting, suicidal ideation, homicidal ideation, auditory or visual hallucinations, alcohol or drug abuse. States her last tetanus shot was one year ago. No other housemates are affected. States that the sensation has changed and it no longer itches her but she feels painful   History reviewed. No pertinent past medical history. Past Surgical History  Procedure Laterality Date  . C secton     History reviewed. No pertinent family history. Social History  Substance Use Topics  . Smoking status: Never Smoker   . Smokeless tobacco: None  . Alcohol Use: Yes   OB History    No data available     Review of Systems  10 systems reviewed and found to be negative, except as noted in the HPI.   Allergies  Review of patient's allergies indicates no known allergies.  Home Medications   Prior to Admission medications   Medication Sig Start Date End Date Taking? Authorizing Provider  hydrocortisone cream 1 % Apply to affected area 2 times daily 02/22/16   Samantha Tripp Dowless, PA-C  hydrOXYzine (ATARAX/VISTARIL) 25 MG tablet Take 1 tablet (25 mg total) by mouth every 6 (six) hours. 02/20/16   Rolm GalaStevi Barrett, PA-C  permethrin (ELIMITE) 5 % cream Apply to entire body other than face - let sit for 14 hours  then wash off, may repeat in 1 week if still having symptoms 02/23/16   Joni ReiningNicole Dillan Lunden, PA-C   BP 116/96 mmHg  Pulse 104  Temp(Src) 99.1 F (37.3 C) (Oral)  Resp 16  SpO2 100% Physical Exam  Constitutional: She is oriented to person, place, and time. She appears well-developed and well-nourished. No distress.  HENT:  Head: Normocephalic and atraumatic.  Mouth/Throat: Oropharynx is clear and moist.  Eyes: Conjunctivae and EOM are normal. Pupils are equal, round, and reactive to light.  Neck: Normal range of motion.  Cardiovascular: Normal rate, regular rhythm and intact distal pulses.   Pulmonary/Chest: Effort normal and breath sounds normal. No stridor. No respiratory distress. She has no wheezes. She exhibits no tenderness.  Abdominal: Soft. Bowel sounds are normal. She exhibits no distension and no mass. There is no tenderness. There is no rebound and no guarding.  Musculoskeletal: Normal range of motion.  Neurological: She is alert and oriented to person, place, and time.  Skin: Rash noted.  Patient has multiple excoriated lesions, partial thickness to all 4 extremities and torso, no warmth, tenderness palpation or surrounding induration. Lesions are blanchable, the spare the palms soles and mucous membranes.  Psychiatric: She has a normal mood and affect.  Anxious  Nursing note and vitals reviewed.   ED Course  Procedures (including critical care time) Labs Review Labs Reviewed - No data to display  Imaging Review No results found. I have personally reviewed and evaluated these images and lab results as part of my medical  decision-making.   EKG Interpretation None      MDM   Final diagnoses:  Formication    Filed Vitals:   02/23/16 1617  BP: 116/96  Pulse: 104  Temp: 99.1 F (37.3 C)  TempSrc: Oral  Resp: 16  SpO2: 100%    Catherine Mccann is 35 y.o. female presenting with sensation of flea bites, she appears agitated feels like they are eating her. No  objective signs of bug bites. 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 Unfortunately, this patient would not qualify for inpatient psychiatric evaluation at this time. I believe she is a danger to herself or others, I reinforced the critical importance of establishingoutpatient care, outpatient psychiatric referral given. A printer out some information on Romualdo Bolk disease from the [facility], will treat her with permethrin.  Evaluation does not show pathology that would require ongoing emergent intervention or inpatient treatment. Pt is hemodynamically stable and mentating appropriately. Discussed findings and plan with patient/guardian, who agrees with care plan. All questions answered. Return precautions discussed and outpatient follow up given.    New Prescriptions   PERMETHRIN (ELIMITE) 5 % CREAM    Apply to entire body other than face - let sit for 14 hours then wash off, may repeat in 1 week if still having symptoms          Wynetta Emery, PA-C 02/23/16 1656  Loren Racer, MD 02/23/16 2317

## 2016-02-24 ENCOUNTER — Telehealth (HOSPITAL_BASED_OUTPATIENT_CLINIC_OR_DEPARTMENT_OTHER): Payer: Self-pay | Admitting: Emergency Medicine

## 2016-02-26 ENCOUNTER — Telehealth (HOSPITAL_BASED_OUTPATIENT_CLINIC_OR_DEPARTMENT_OTHER): Payer: Self-pay | Admitting: Emergency Medicine

## 2016-05-19 ENCOUNTER — Emergency Department (HOSPITAL_COMMUNITY)
Admission: EM | Admit: 2016-05-19 | Discharge: 2016-05-19 | Disposition: A | Discharge status: Home / Self Care | Payer: Medicaid Other | Attending: Emergency Medicine | Admitting: Emergency Medicine

## 2016-05-19 ENCOUNTER — Encounter (HOSPITAL_COMMUNITY): Payer: Self-pay | Admitting: *Deleted

## 2016-05-19 DIAGNOSIS — L089 Local infection of the skin and subcutaneous tissue, unspecified: Secondary | ICD-10-CM | POA: Insufficient documentation

## 2016-05-19 DIAGNOSIS — R21 Rash and other nonspecific skin eruption: Secondary | ICD-10-CM | POA: Diagnosis present

## 2016-05-19 HISTORY — DX: Carrier or suspected carrier of methicillin resistant Staphylococcus aureus: Z22.322

## 2016-05-19 MED ORDER — CHLORHEXIDINE GLUCONATE 4 % EX LIQD: Freq: Every day | CUTANEOUS | 0 refills | Status: AC | PRN

## 2016-05-19 MED ORDER — DOXYCYCLINE HYCLATE 100 MG PO CAPS: 100.0000 mg | ORAL_CAPSULE | Freq: Two times a day (BID) | ORAL | 0 refills | Status: AC

## 2016-05-19 MED ORDER — MUPIROCIN CALCIUM 2 % NA OINT: TOPICAL_OINTMENT | NASAL | 0 refills | Status: AC

## 2016-05-19 NOTE — ED Triage Notes (Signed)
Pt states that she was diagnosed with MRSA and was treated in June. Pt states that the areas have returned To Face, arms, and thighs.

## 2016-05-19 NOTE — ED Provider Notes (Signed)
[facility]-EMERGENCY DEPT Provider Note   CSN: [id number] Arrival date & time: 05/19/16  [id number]  By signing my name below, I, Catherine Mccann, attest that this documentation has been prepared under the direction and in the presence of Arthor CaptainAbigail Juelz Whittenberg, PA-C. Electronically Signed: Placido SouLogan Mccann, ED Scribe. 05/19/16. 11:02 AM.   History   Chief Complaint Chief Complaint  Patient presents with  . Recurrent Skin Infections    HPI HPI Comments: Catherine Mccann is a 35 y.o. 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 Pt states she had a culture performed and was dx with MRSA in June of 2017 and was given a 14 day course of abx which she completed in early July. Pt states her new symptoms began a few weeks ago and are consistent with prior outbreaks. Pt states her children have started to experience similar rashes recently. She denies other associated symptoms at this time.   The history is provided by the patient. No language interpreter was used.    Past Medical History:  Diagnosis Date  . MRSA (methicillin resistant staph aureus) culture positive      There are no active problems to display for this patient.   Past Surgical History:  Procedure Laterality Date  . C SECTON      OB History    No data available       Home Medications    Prior to Admission medications   Medication Sig Start Date End Date Taking? Authorizing Provider  hydrocortisone cream 1 % Apply to affected area 2 times daily 02/22/16   Samantha Tripp Dowless, PA-C  hydrOXYzine (ATARAX/VISTARIL) 25 MG tablet Take 1 tablet (25 mg total) by mouth every 6 (six) hours. 02/20/16   Rolm GalaStevi Barrett, PA-C  permethrin (ELIMITE) 5 % cream Apply to entire body other than face - let sit for 14 hours then wash off, may repeat in 1 week if still having symptoms 02/23/16   Wynetta EmeryNicole Pisciotta, PA-C    Family History No family history on  file.  Social History Social History  Substance Use Topics  . Smoking status: Never Smoker  . Smokeless tobacco: Not on file  . Alcohol use Yes     Allergies   Review of patient's allergies indicates no known allergies.   Review of Systems Review of Systems  Constitutional: Negative for chills and fever.  Skin: Positive for color change and rash.   Physical Exam Updated Vital Signs BP 126/64 (BP Location: Right Arm)   Pulse 68   Temp 98.2 F (36.8 C) (Oral)   Resp 18   Ht 5\' 4"  (1.626 m)   Wt 163 lb (73.9 kg)   LMP 05/12/2016   SpO2 100%   BMI 27.98 kg/m   Physical Exam  Constitutional: She is oriented to person, place, and time. She appears well-developed and well-nourished.  HENT:  Head: Normocephalic and atraumatic.  Eyes: EOM are normal.  Neck: Normal range of motion.  Cardiovascular: Normal rate.   Pulmonary/Chest: Effort normal. No respiratory distress.  Abdominal: Soft.  Musculoskeletal: Normal range of motion.  Neurological: She is alert and oriented to person, place, and time.  Skin: Skin is warm and dry.  Psychiatric: She has a normal mood and affect.  Nursing note and vitals reviewed.  ED Treatments / Results  Labs (all labs ordered are listed, but only abnormal results are displayed) Labs Reviewed - No data to  display  EKG  EKG Interpretation None       Radiology No results found.  Procedures Procedures  DIAGNOSTIC STUDIES: Oxygen Saturation is 100% on RA, normal by my interpretation.    COORDINATION OF CARE: 11:00 AM Discussed next steps with pt. Pt verbalized understanding and is agreeable with the plan.    Medications Ordered in ED Medications - No data to display   Initial Impression / Assessment and Plan / ED Course  I have reviewed the triage vital signs and the nursing notes.  Pertinent labs & imaging results that were available during my care of the patient were reviewed by me and considered in my medical decision  making (see chart for details).  Clinical Course    Patient with nonspecific eruption. She says she's been diagnosed with MRSA. Is unable to find this in review of her chart from [facility] and other health systems, however, will discharge to detox. He, nasal Bactroban ointment and chlorhexidine. Pierce safe for discharge at this time I personally performed the services described in this documentation, which was scribed in my presence. The recorded information has been reviewed and is accurate.    Final Clinical Impressions(s) / ED Diagnoses   Final diagnoses:  None    New Prescriptions New Prescriptions   No medications on file     Arthor Captain, PA-C 05/20/16 2312    Cathren Laine, MD 05/23/16 [id number]

## 2016-07-09 ENCOUNTER — Emergency Department (HOSPITAL_COMMUNITY)
Admission: EM | Admit: 2016-07-09 | Discharge: 2016-07-09 | Disposition: A | Discharge status: Home / Self Care | Payer: Medicaid Other | Attending: Emergency Medicine | Admitting: Emergency Medicine

## 2016-07-09 ENCOUNTER — Encounter (HOSPITAL_COMMUNITY): Payer: Self-pay | Admitting: Emergency Medicine

## 2016-07-09 DIAGNOSIS — L708 Other acne: Secondary | ICD-10-CM

## 2016-07-09 DIAGNOSIS — R21 Rash and other nonspecific skin eruption: Secondary | ICD-10-CM | POA: Diagnosis present

## 2016-07-09 DIAGNOSIS — L270 Generalized skin eruption due to drugs and medicaments taken internally: Secondary | ICD-10-CM | POA: Insufficient documentation

## 2016-07-09 MED ORDER — CHLORHEXIDINE GLUCONATE 4 % EX LIQD: Freq: Every day | CUTANEOUS | 0 refills | Status: AC | PRN

## 2016-07-09 NOTE — ED Notes (Signed)
Pt verbalized understanding of d/c instructions and has no further questions. Pt is stable, A&Ox4, VSS.  

## 2016-07-09 NOTE — ED Provider Notes (Signed)
[facility]-EMERGENCY DEPT Provider Note   CSN: [id number] Arrival date & time: 07/09/16  1752     History   Chief Complaint Chief Complaint  Patient presents with  . Rash    HPI Catherine Mccann is a 35 y.o. female.  HPI 35 year old female who presents with rash. States she has recurrent rash ongoing for several years over body and face. [facility] and told she has MRSA culture positive of her rash. [facility], improved but recurred. Seen in our adult ED before and states given doxycycline and chlorhexidine wash which improved symptoms temporarily but rash returned. Using castor oil over lesions with help. No fever or chills. Using exfoliating body wash at home but no new detergents or soaps.  Past Medical History:  Diagnosis Date  . MRSA (methicillin resistant staph aureus) culture positive     There are no active problems to display for this patient.   Past Surgical History:  Procedure Laterality Date  . C SECTON      OB History    No data available       Home Medications    Prior to Admission medications   Medication Sig Start Date End Date Taking? Authorizing Provider  chlorhexidine (HIBICLENS) 4 % external liquid Apply topically daily as needed. 05/19/16   Arthor CaptainAbigail Harris, PA-C  chlorhexidine (HIBICLENS) 4 % external liquid Apply topically daily as needed. 07/09/16   Lavera Guiseana Duo Jaylen Knope, MD  doxycycline (VIBRAMYCIN) 100 MG capsule Take 1 capsule (100 mg total) by mouth 2 (two) times daily. 05/19/16   Arthor CaptainAbigail Harris, PA-C  hydrocortisone cream 1 % Apply to affected area 2 times daily 02/22/16   Samantha Tripp Dowless, PA-C  hydrOXYzine (ATARAX/VISTARIL) 25 MG tablet Take 1 tablet (25 mg total) by mouth every 6 (six) hours. 02/20/16   Rolm GalaStevi Barrett, PA-C  mupirocin nasal ointment (BACTROBAN) 2 % Apply in each nostril daily for 10 days 05/19/16   Arthor CaptainAbigail Harris, PA-C  permethrin (ELIMITE) 5 % cream Apply to entire body other than face - let sit for 14 hours  then wash off, may repeat in 1 week if still having symptoms 02/23/16   Wynetta EmeryNicole Pisciotta, PA-C    Family History No family history on file.  Social History Social History  Substance Use Topics  . Smoking status: Never Smoker  . Smokeless tobacco: Never Used  . Alcohol use Yes     Allergies   Review of patient's allergies indicates no known allergies.   Review of Systems Review of Systems  Constitutional: Negative for fever.  Respiratory: Negative for shortness of breath.   Skin: Positive for rash.  Allergic/Immunologic: Negative for immunocompromised state.  Hematological: Does not bruise/bleed easily.  All other systems reviewed and are negative.    Physical Exam Updated Vital Signs BP 129/74   Pulse 66   Temp 98.1 F (36.7 C) (Oral)   Resp 16   Wt 156 lb 4.9 oz (70.9 kg)   SpO2 100%   BMI 26.83 kg/m   Physical Exam Physical Exam  Nursing note and vitals reviewed. Constitutional: Well developed, well nourished, non-toxic, and in no acute distress Head: Normocephalic and atraumatic.  Mouth/Throat: Oropharynx is clear and moist.  Neck: Normal range of motion. Neck supple.  Cardiovascular: Normal rate and regular rhythm.   Pulmonary/Chest: Effort normal and breath sounds normal.  Abdominal: Soft. There is no tenderness. There is no rebound and no guarding.  Musculoskeletal: Normal range of motion.  Neurological: Alert,  no facial droop, fluent speech, moves all extremities symmetrically Skin: Skin is warm and dry. acneiform lesions over body, face, torso, extremities of various stages of healing. Psychiatric: Cooperative   ED Treatments / Results  Labs (all labs ordered are listed, but only abnormal results are displayed) Labs Reviewed - No data to display  EKG  EKG Interpretation None       Radiology No results found.  Procedures Procedures (including critical care time)  Medications Ordered in ED Medications - No data to display   Initial  Impression / Assessment and Plan / ED Course  I have reviewed the triage vital signs and the nursing notes.  Pertinent labs & imaging results that were available during my care of the patient were reviewed by me and considered in my medical decision making (see chart for details).  Clinical Course    Presenting with recurrent rash, Temporarily improved by antibiotics and chlorhexidine wash. Acneiform lesion in various stages of healing over body and face. No evidence of cellulitis. No mucous membrane involvement or involvement of palms or soles. Records reviewed, and has been treated in the past for bedbugs without improvement. Patient requesting refill of her chlorhexidine wash. Do not think that she requires antibiotics at this time. she is given referral for dermatology. Strict return and follow-up instructions reviewed. She/He expressed understanding of all discharge instructions and felt comfortable with the plan of care.   Final Clinical Impressions(s) / ED Diagnoses   Final diagnoses:  Skin eruption  Acneiform eruption    New Prescriptions New Prescriptions   CHLORHEXIDINE (HIBICLENS) 4 % EXTERNAL LIQUID    Apply topically daily as needed.     Lavera Guise, MD 07/09/16 Mikle Bosworth

## 2016-07-09 NOTE — ED Triage Notes (Signed)
Pt Dx with MRSA infection back for recurring breakout on face. NAD. Afebrile.

## 2016-07-09 NOTE — Discharge Instructions (Signed)
Please follow-up with dermatologist.  Take new medications as prescribed.  Return for worsening symptoms, including fever, difficulty breathing or any other symptoms concerning to you.
